# Patient Record
Sex: Male | Born: 1985 | Race: White | Hispanic: No | Marital: Married
Health system: Southern US, Community
[De-identification: ages and names within clinical notes are randomized; demographics above are authoritative.]

---

## 2004-06-14 ENCOUNTER — Emergency Department: Payer: Self-pay | Admitting: General Practice

## 2004-09-09 ENCOUNTER — Emergency Department (HOSPITAL_COMMUNITY): Admission: AC | Admit: 2004-09-09 | Discharge: 2004-09-09 | Payer: Self-pay

## 2004-11-21 ENCOUNTER — Ambulatory Visit: Payer: Self-pay | Admitting: Internal Medicine

## 2004-11-21 ENCOUNTER — Ambulatory Visit: Payer: Self-pay | Admitting: *Deleted

## 2009-07-30 ENCOUNTER — Emergency Department (HOSPITAL_COMMUNITY): Admission: EM | Admit: 2009-07-30 | Discharge: 2009-07-30 | Payer: Self-pay | Admitting: Emergency Medicine

## 2009-10-10 ENCOUNTER — Emergency Department (HOSPITAL_COMMUNITY): Admission: EM | Admit: 2009-10-10 | Discharge: 2009-10-10 | Payer: Self-pay | Admitting: Emergency Medicine

## 2009-12-28 ENCOUNTER — Emergency Department (HOSPITAL_COMMUNITY): Admission: EM | Admit: 2009-12-28 | Discharge: 2009-12-28 | Payer: Self-pay | Admitting: Emergency Medicine

## 2010-02-19 ENCOUNTER — Emergency Department (HOSPITAL_COMMUNITY): Admission: EM | Admit: 2010-02-19 | Discharge: 2010-02-19 | Payer: Self-pay | Admitting: Emergency Medicine

## 2010-07-07 ENCOUNTER — Emergency Department (HOSPITAL_COMMUNITY): Admission: EM | Admit: 2010-07-07 | Discharge: 2010-07-07 | Payer: Self-pay | Admitting: Emergency Medicine

## 2010-07-21 ENCOUNTER — Emergency Department (HOSPITAL_COMMUNITY): Admission: EM | Admit: 2010-07-21 | Discharge: 2009-12-20 | Payer: Self-pay | Admitting: Emergency Medicine

## 2010-08-05 ENCOUNTER — Emergency Department (HOSPITAL_COMMUNITY)
Admission: EM | Admit: 2010-08-05 | Discharge: 2010-08-05 | Payer: Self-pay | Source: Home / Self Care | Admitting: Emergency Medicine

## 2010-10-25 LAB — URINALYSIS, ROUTINE W REFLEX MICROSCOPIC
Bilirubin Urine: NEGATIVE
Glucose, UA: NEGATIVE mg/dL
Hgb urine dipstick: NEGATIVE
Ketones, ur: NEGATIVE mg/dL
Protein, ur: NEGATIVE mg/dL

## 2011-07-17 IMAGING — CR DG FOOT COMPLETE 3+V*L*
3 series · 3 of 3 positions shown · non-contrast
Comparison: None.

CLINICAL DATA: Blunt object fell on foot, pain.

LEFT FOOT - COMPLETE 3+ VIEW

[t foot ap left]
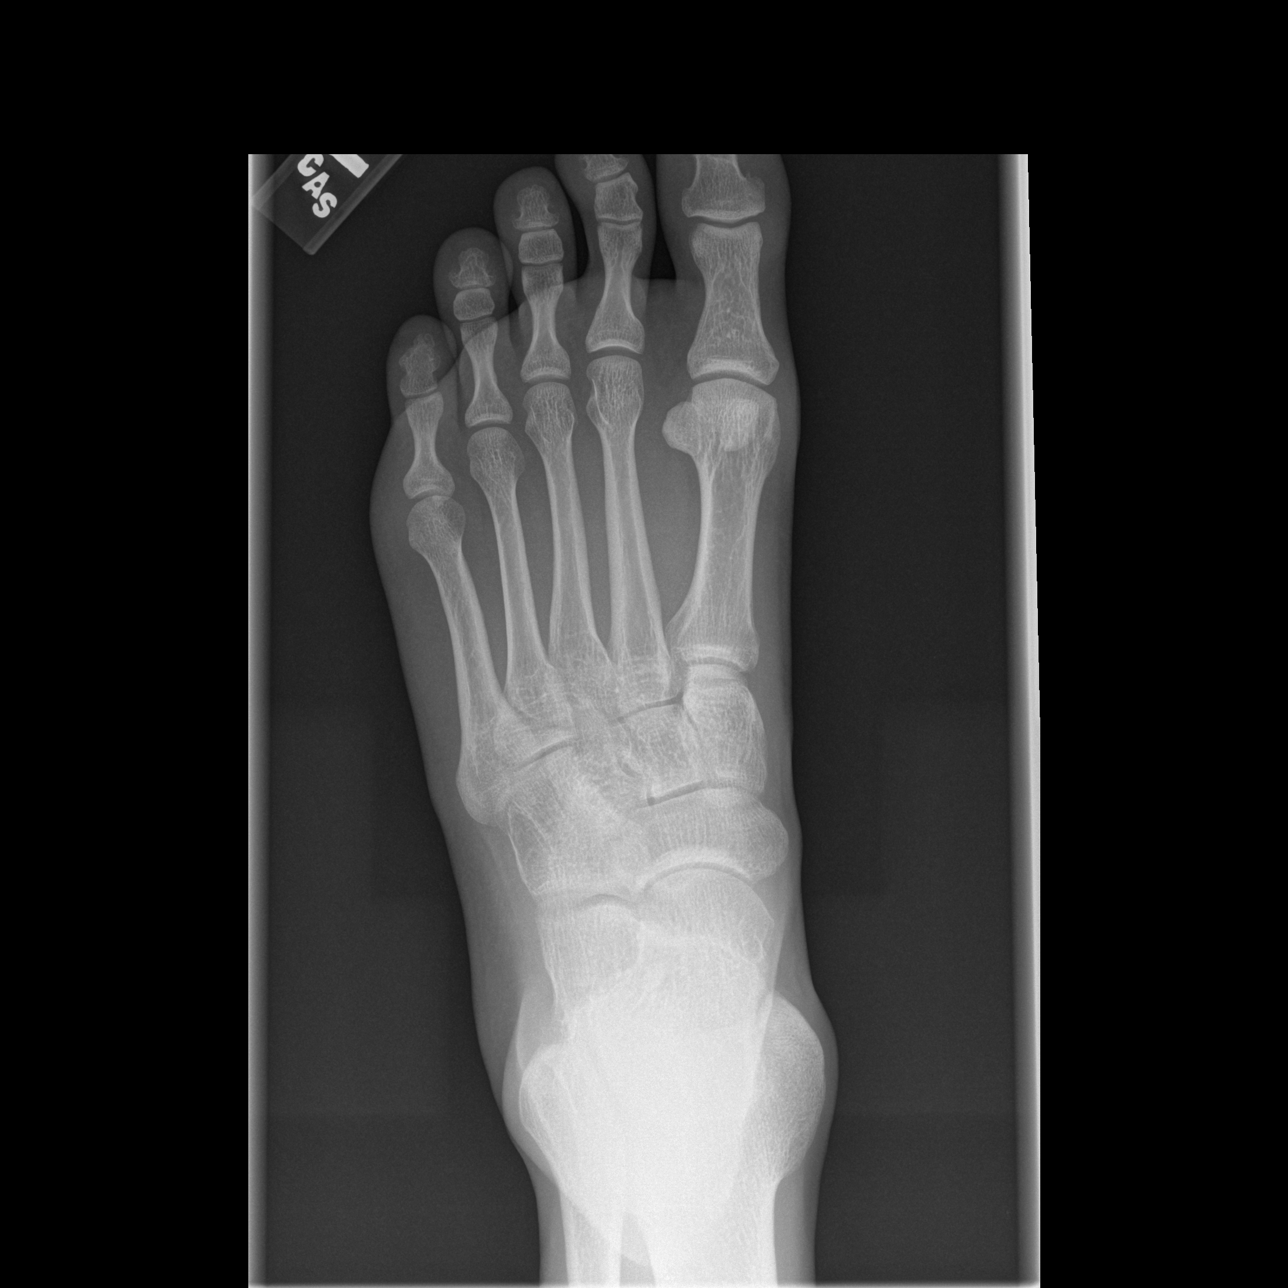

[t foot oblique left]
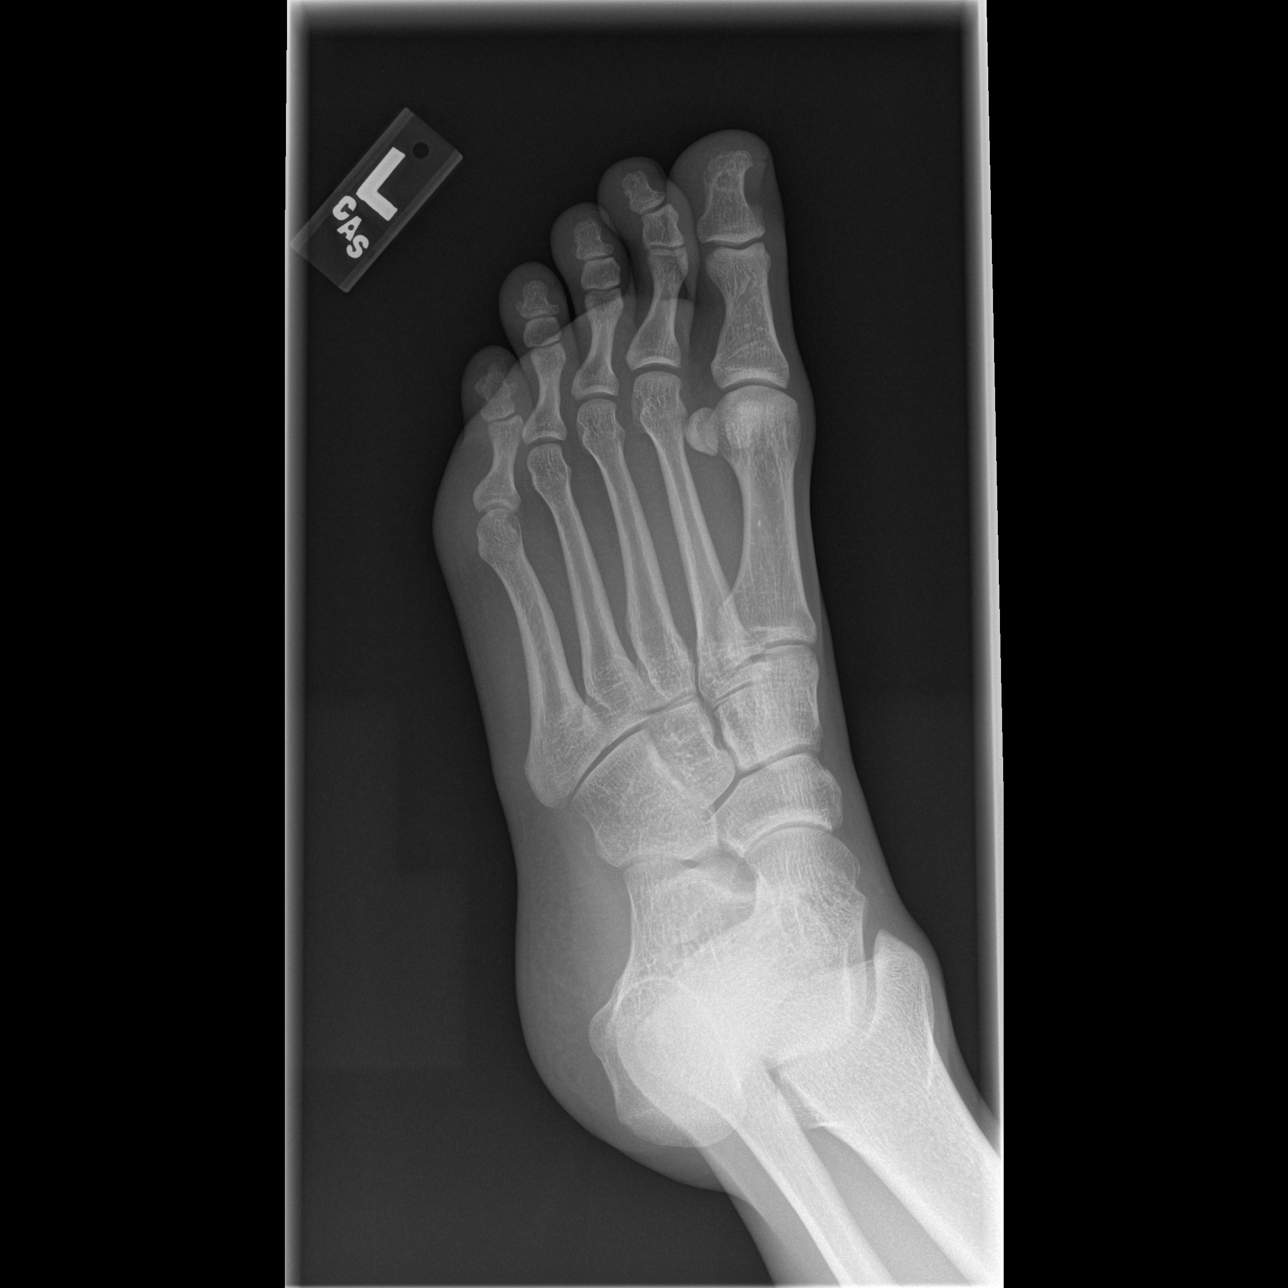

[t foot lat left]
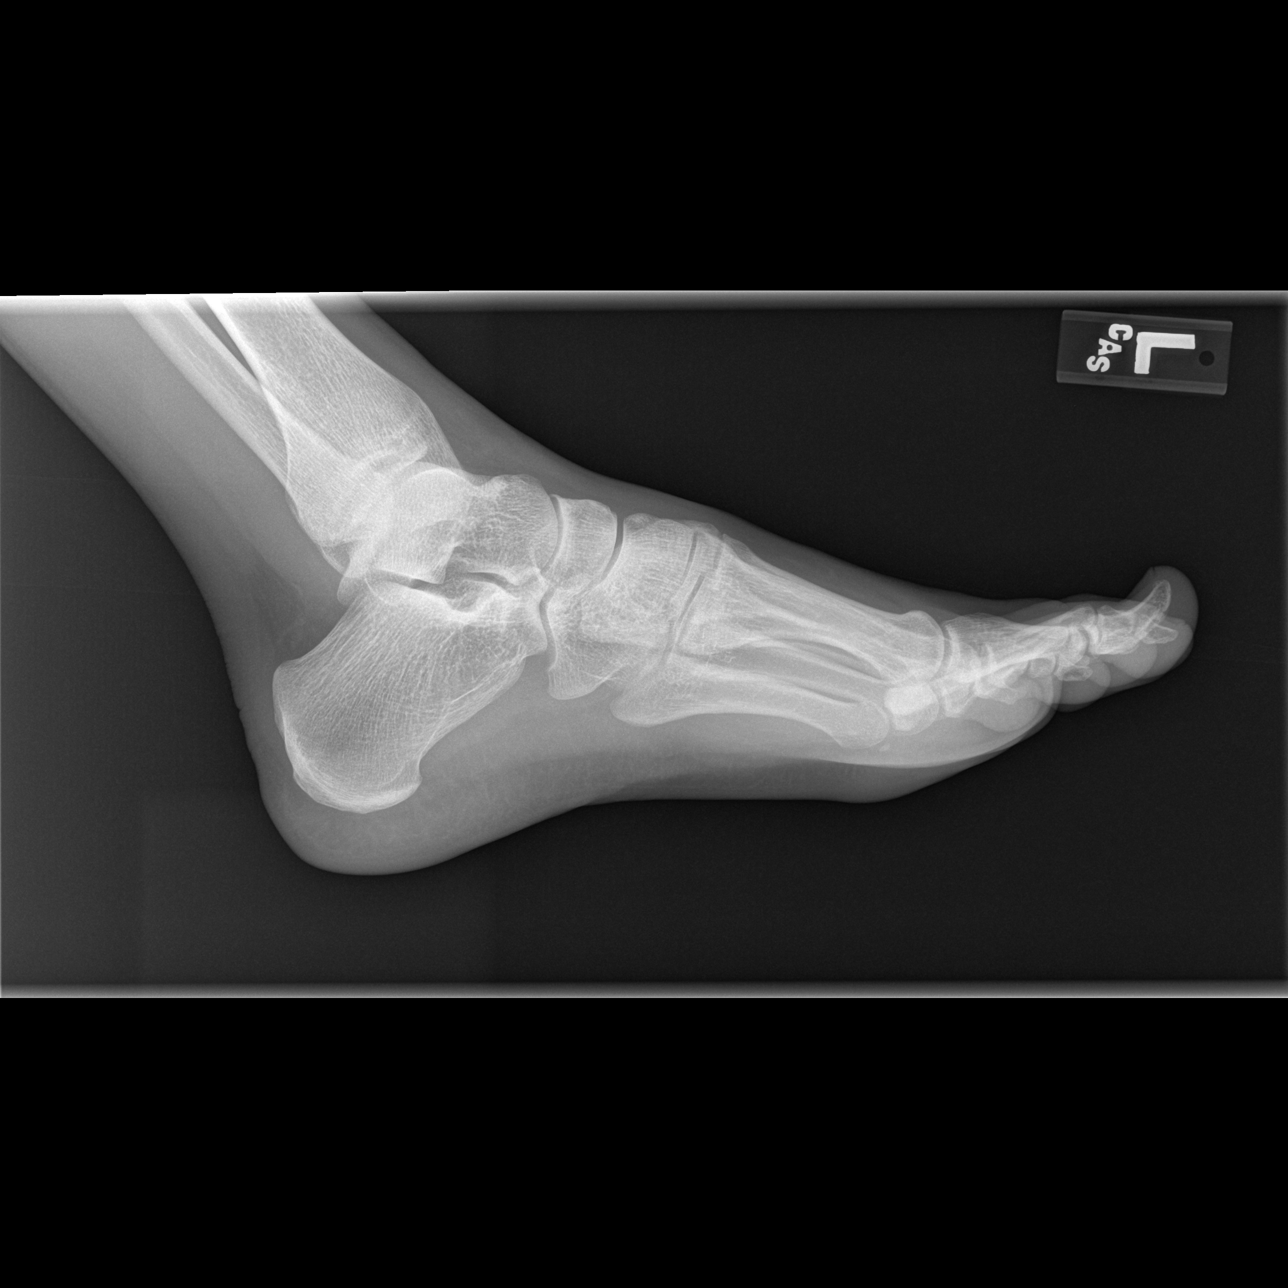

[3 of 3 positions shown; findings below may reference images not displayed]

FINDINGS: There is no evidence of fracture or dislocation.  There
is no evidence of arthropathy or other focal bone abnormality.
Soft tissues are unremarkable.
IMPRESSION: Negative.

## 2012-07-08 ENCOUNTER — Ambulatory Visit: Payer: Self-pay | Admitting: Family Medicine

## 2012-07-08 VITALS — BP 118/76 | HR 69 | Temp 98.2°F | Resp 16 | Ht 68.5 in | Wt 120.0 lb

## 2012-07-08 DIAGNOSIS — Z0289 Encounter for other administrative examinations: Secondary | ICD-10-CM

## 2012-07-08 DIAGNOSIS — Z0000 Encounter for general adult medical examination without abnormal findings: Secondary | ICD-10-CM

## 2012-07-08 NOTE — Progress Notes (Signed)
[facility] [address], [place] [address] [phone]  Date:  07/08/2012   Name:  Richard Bailey   DOB:  Apr 09, 1986   MRN:  [id number]  PCP:  No primary provider on file.    Chief Complaint: Annual Exam   History of Present Illness:  Richard Bailey is a 26 y.o. 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  However, he is not sure why he has to complete this physical.  He received this form once before in 2010.   He denies any medical problems, states he does not abuse any substances.  He has no history of a DUI.  Reports he did receive a ticket/ was arrested for drinking underage at 74 but no other problems. He has had a ticket for seatbelt violation but denies any other driving violations/ accidents.   He is a new patient here today to have his DMV physical completed.  He has no other concerns today.  He has tried to call and find out why he is being sent this form but so far has not been successful   Denies any vision, hearing, neurological, cardiac, pulmonary, mental/emotional, substance abuse, MSK, or other problems   There is no problem list on file for this patient.   No past medical history on file.  No past surgical history on file.  History  Substance Use Topics  . Smoking status: Current Every Day Smoker  . Smokeless tobacco: Not on file  . Alcohol Use: No    No family history on file.  No Known Allergies  Medication list has been reviewed and updated.  No current outpatient prescriptions on file prior to visit.    Review of Systems:  As per HPI- otherwise negative.   Physical Examination: Filed Vitals:   07/08/12 1432  BP: 118/76  Pulse: 69  Temp: 98.2 F (36.8 C)  Resp: 16   Filed Vitals:   07/08/12 1432  Height: 5' 8.5" (1.74 m)  Weight: 120 lb (54.432 kg)   Body mass index is 17.98 kg/(m^2). Ideal Body Weight: Weight in (lb) to have BMI = 25: 166.5    GEN: WDWN, NAD, Non-toxic, A & O x 3.  His wife is present in the room today.  Thin build HEENT: Atraumatic, Normocephalic. Neck supple. No masses, No LAD. Ears and Nose: No external deformity. CV: RRR, No M/G/R. No JVD. No thrill. No extra heart sounds. PULM: CTA B, no wheezes, crackles, rhonchi. No retractions. No resp. distress. No accessory muscle use. ABD: S, NT, ND, +BS. No rebound. No HSM. EXTR: No c/c/e NEURO Normal gait. Normal strength, sensation and DTR all extremities.   PSYCH: Normally interactive. Conversant. Not depressed or anxious appearing.  Calm demeanor.    Assessment and Plan: 1. Physical exam, routine    Completed form for patient- stated that I do not think he needs to continue to follow- up for periodic evaluations.     Abbe Amsterdam, MD

## 2015-11-14 ENCOUNTER — Encounter (HOSPITAL_COMMUNITY): Payer: Self-pay | Admitting: Emergency Medicine

## 2015-11-14 ENCOUNTER — Emergency Department (HOSPITAL_COMMUNITY): Payer: BLUE CROSS/BLUE SHIELD

## 2015-11-14 ENCOUNTER — Emergency Department (HOSPITAL_COMMUNITY)
Admission: EM | Admit: 2015-11-14 | Discharge: 2015-11-14 | Disposition: A | Discharge status: Home / Self Care | Payer: BLUE CROSS/BLUE SHIELD | Attending: Emergency Medicine | Admitting: Emergency Medicine

## 2015-11-14 DIAGNOSIS — M25521 Pain in right elbow: Secondary | ICD-10-CM | POA: Diagnosis not present

## 2015-11-14 DIAGNOSIS — F17200 Nicotine dependence, unspecified, uncomplicated: Secondary | ICD-10-CM | POA: Diagnosis not present

## 2015-11-14 DIAGNOSIS — M791 Myalgia, unspecified site: Secondary | ICD-10-CM

## 2015-11-14 DIAGNOSIS — M25522 Pain in left elbow: Secondary | ICD-10-CM | POA: Diagnosis not present

## 2015-11-14 DIAGNOSIS — R509 Fever, unspecified: Secondary | ICD-10-CM | POA: Insufficient documentation

## 2015-11-14 DIAGNOSIS — M25562 Pain in left knee: Secondary | ICD-10-CM | POA: Insufficient documentation

## 2015-11-14 DIAGNOSIS — M2550 Pain in unspecified joint: Secondary | ICD-10-CM

## 2015-11-14 DIAGNOSIS — M436 Torticollis: Secondary | ICD-10-CM | POA: Diagnosis not present

## 2015-11-14 DIAGNOSIS — R21 Rash and other nonspecific skin eruption: Secondary | ICD-10-CM | POA: Diagnosis not present

## 2015-11-14 DIAGNOSIS — M25561 Pain in right knee: Secondary | ICD-10-CM | POA: Insufficient documentation

## 2015-11-14 DIAGNOSIS — R6889 Other general symptoms and signs: Secondary | ICD-10-CM

## 2015-11-14 DIAGNOSIS — R059 Cough, unspecified: Secondary | ICD-10-CM

## 2015-11-14 DIAGNOSIS — R05 Cough: Secondary | ICD-10-CM | POA: Diagnosis present

## 2015-11-14 DIAGNOSIS — R1110 Vomiting, unspecified: Secondary | ICD-10-CM | POA: Insufficient documentation

## 2015-11-14 LAB — COMPREHENSIVE METABOLIC PANEL
ALBUMIN: 3.4 g/dL — AB (ref 3.5–5.0)
ALK PHOS: 89 U/L (ref 38–126)
ALT: 58 U/L (ref 17–63)
ANION GAP: 10 (ref 5–15)
AST: 69 U/L — ABNORMAL HIGH (ref 15–41)
BUN: 12 mg/dL (ref 6–20)
CO2: 22 mmol/L (ref 22–32)
CREATININE: 0.51 mg/dL — AB (ref 0.61–1.24)
Calcium: 8.4 mg/dL — ABNORMAL LOW (ref 8.9–10.3)
Chloride: 104 mmol/L (ref 101–111)
GFR calc non Af Amer: >60 mL/min (ref >60)
GLUCOSE: 96 mg/dL (ref 65–99)
POTASSIUM: 3.8 mmol/L (ref 3.5–5.1)
Sodium: 136 mmol/L (ref 135–145)
Total Bilirubin: 0.7 mg/dL (ref 0.3–1.2)
Total Protein: 6.5 g/dL (ref 6.5–8.1)

## 2015-11-14 LAB — CBC WITH DIFFERENTIAL/PLATELET
BASOS ABS: 0 10*3/uL (ref 0.0–0.1)
Basophils Relative: 0 %
EOS ABS: 0 10*3/uL (ref 0.0–0.7)
Eosinophils Relative: 0 %
HEMATOCRIT: 33.8 % — AB (ref 39.0–52.0)
HEMOGLOBIN: 12 g/dL — AB (ref 13.0–17.0)
LYMPHS PCT: 12 %
Lymphs Abs: 0.5 10*3/uL — ABNORMAL LOW (ref 0.7–4.0)
MCH: 29.5 pg (ref 26.0–34.0)
MCHC: 35.5 g/dL (ref 30.0–36.0)
MCV: 83 fL (ref 78.0–100.0)
MONOS PCT: 2 %
Monocytes Absolute: 0.1 10*3/uL (ref 0.1–1.0)
NEUTROS ABS: 3.5 10*3/uL (ref 1.7–7.7)
Neutrophils Relative %: 86 %
Platelets: 133 10*3/uL — ABNORMAL LOW (ref 150–400)
RBC: 4.07 MIL/uL — AB (ref 4.22–5.81)
RDW: 13.1 % (ref 11.5–15.5)
WBC: 4.1 10*3/uL (ref 4.0–10.5)

## 2015-11-14 LAB — I-STAT CG4 LACTIC ACID, ED: LACTIC ACID, VENOUS: 1.29 mmol/L (ref 0.5–2.0)

## 2015-11-14 LAB — CK: CK TOTAL: 67 U/L (ref 49–397)

## 2015-11-14 MED ORDER — ONDANSETRON HCL 4 MG/2ML IJ SOLN
4.0000 mg | Freq: Once | INTRAMUSCULAR | Status: AC
  Administered 2015-11-14: 4 mg via INTRAVENOUS
  Filled 2015-11-14: qty 2

## 2015-11-14 MED ORDER — OXYCODONE-ACETAMINOPHEN 5-325 MG PO TABS: 1.0000 | ORAL_TABLET | Freq: Three times a day (TID) | ORAL | Status: DC | PRN

## 2015-11-14 MED ORDER — DOXYCYCLINE HYCLATE 100 MG PO CAPS: ORAL_CAPSULE | ORAL | Status: DC

## 2015-11-14 MED ORDER — SODIUM CHLORIDE 0.9 % IV BOLUS (SEPSIS)
1000.0000 mL | Freq: Once | INTRAVENOUS | Status: AC
  Administered 2015-11-14: 1000 mL via INTRAVENOUS

## 2015-11-14 MED ORDER — MORPHINE SULFATE (PF) 4 MG/ML IV SOLN
4.0000 mg | Freq: Once | INTRAVENOUS | Status: AC
  Administered 2015-11-14: 4 mg via INTRAVENOUS
  Filled 2015-11-14: qty 1

## 2015-11-14 MED ORDER — IBUPROFEN 600 MG PO TABS
600.0000 mg | ORAL_TABLET | Freq: Four times a day (QID) | ORAL | Status: DC | PRN
Start: 2015-11-14 — End: 2015-11-16

## 2015-11-14 MED ORDER — KETOROLAC TROMETHAMINE 30 MG/ML IJ SOLN
30.0000 mg | Freq: Once | INTRAMUSCULAR | Status: AC
  Administered 2015-11-14: 30 mg via INTRAVENOUS
  Filled 2015-11-14: qty 1

## 2015-11-14 NOTE — Discharge Instructions (Signed)

## 2015-11-14 NOTE — ED Provider Notes (Signed)
CSN: [id number]     Arrival date & time 11/14/15  0041 History  By signing my name below, I, Emmanuella Mensah, attest that this documentation has been prepared under the direction and in the presence of Zadie Rhine, MD. Electronically Signed: Angelene Giovanni, ED Scribe. 11/14/2015. 3:35 AM.   Chief Complaint -  Fever and cough  Patient is a 30 y.o. male presenting with rash. The history is provided by the patient. No language interpreter was used.  Rash Location:  Full body Severity:  Moderate Onset quality:  Gradual Timing:  Constant Progression:  Spreading Chronicity:  New Context: not insect bite/sting   Relieved by:  None tried Worsened by:  Nothing tried Ineffective treatments:  None tried Associated symptoms: fever, joint pain and myalgias   Associated symptoms: no headaches, no nausea, no sore throat and not vomiting   Associated symptoms comment:  Cough   HPI Comments: Richard Bailey is a 30 y.o. male who presents to the Emergency Department complaining of gradually worsening generalized joint pain to his elbows, hands, knees, and his ankles onset yesterday. Pt reports associated generalized rash all over his body, mild neck stiffness, and joint swelling. Pt's wife explains that pt had vomiting and a fever one week ago after their son had the same symptoms but pt tested negative for the flu and mono. No alleviating factors noted. Pt was seen today at [facility] and had a normal chest x-ray and negative mono test. 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 Pt denies any current n/v, HA, LOC   PMH - none Soc hx - no travel, no drug abuse Social History  Substance Use Topics  . Smoking status: Current Every Day Smoker  . Smokeless tobacco: None  . Alcohol Use: No    Review of Systems  Constitutional: Positive for fever.  HENT: Negative for sore throat.   Respiratory: Negative for cough.   Gastrointestinal: Negative for nausea and vomiting.   Musculoskeletal: Positive for myalgias, arthralgias and neck stiffness.  Skin: Positive for rash.  Neurological: Negative for syncope and headaches.  All other systems reviewed and are negative.     Allergies  Review of patient's allergies indicates no known allergies.  Home Medications   Prior to Admission medications   Medication Sig Start Date End Date Taking? Authorizing Provider  acetaminophen (TYLENOL) 500 MG tablet Take 1,000 mg by mouth every 6 (six) hours as needed for moderate pain.   Yes Historical Provider, MD  ibuprofen (ADVIL,MOTRIN) 200 MG tablet Take 600 mg by mouth every 6 (six) hours as needed for moderate pain.   Yes Historical Provider, MD   BP 112/65 mmHg  Pulse 75  Temp(Src) 98.2 F (36.8 C) (Oral)  Resp 18  Ht  (1.778 m)  Wt 132 lb (59.875 kg)  BMI 18.94 kg/m2  SpO2 99% Physical Exam  Nursing note and vitals reviewed.  CONSTITUTIONAL: uncomfortable appearing HEAD: Normocephalic/atraumatic EYES: EOMI/PERRL ENMT: Mucous membranes moist, uvula midline, no erythema or exudate NECK: supple no meningeal signs SPINE/BACK:entire spine nontender CV: S1/S2 noted, no murmurs/rubs/gallops noted LUNGS: Lungs are clear to auscultation bilaterally, no apparent distress ABDOMEN: soft, nontender, no rebound or guarding, bowel sounds noted throughout abdomen GU:no cva tenderness NEURO: Pt is awake/alert/appropriate, moves all extremitiesx4.  No facial droop.   EXTREMITIES: pulses normal/equal, full ROM, tenderness to hands/knees/feet, but no significant joint effusion noted.  No warmth noted to any of his joints.   SKIN: warm, color normal, diffuse macular  rash to body that blanches, no petechiae, no rash to palms of hands PSYCH: no abnormalities of mood noted, alert and oriented to situation  ED Course  Procedures DIAGNOSTIC STUDIES: Oxygen Saturation is 99% on RA, normal by my interpretation.    COORDINATION OF CARE: 3:29 AM- Pt advised of plan for  treatment and pt agrees. Pt will receive IV fluids, Morphine, and Zofran. He will also receive lab work for further evaluation. Explained possible admission pending lab results.   7:42 AM Pt is improved He is awake/alert, watching TV He has not been febrile here He is not septic appearing This could be viral illness, however RMSF is not excluded Due to rash/arthralgias, will add on doxycycline for RMSF coverage Blood cultures pending Will d/c home Discussed strict return precautions  Labs Review Labs Reviewed  COMPREHENSIVE METABOLIC PANEL - Abnormal; Notable for the following:    Creatinine, Ser 0.51 (*)    Calcium 8.4 (*)    Albumin 3.4 (*)    AST 69 (*)    All other components within normal limits  CBC WITH DIFFERENTIAL/PLATELET - Abnormal; Notable for the following:    RBC 4.07 (*)    Hemoglobin 12.0 (*)    HCT 33.8 (*)    Platelets 133 (*)    Lymphs Abs 0.5 (*)    All other components within normal limits  CULTURE, BLOOD (ROUTINE X 2)  CULTURE, BLOOD (ROUTINE X 2)  CK  CBC WITH DIFFERENTIAL/PLATELET  ROCKY MTN SPOTTED FVR ABS PNL(IGG+IGM)  URINALYSIS, ROUTINE W REFLEX MICROSCOPIC ([facility])  I-STAT CG4 LACTIC ACID, ED      Zadie Rhineonald Kemarion Abbey, MD has personally reviewed and evaluated these  lab results as part of his medical decision-making.  Medications  sodium chloride 0.9 % bolus 1,000 mL (0 mLs Intravenous Stopped 11/14/15 0549)  morphine 4 MG/ML injection 4 mg (4 mg Intravenous Given 11/14/15 0449)  ondansetron (ZOFRAN) injection 4 mg (4 mg Intravenous Given 11/14/15 0449)  ketorolac (TORADOL) 30 MG/ML injection 30 mg (30 mg Intravenous Given 11/14/15 0648)  morphine 4 MG/ML injection 4 mg (4 mg Intravenous Given 11/14/15 0648)    MDM   Final diagnoses:  Myalgia  Cough  Flu-like symptoms  Arthralgia    Nursing notes including past medical history and social history reviewed and considered in documentation Labs/vital reviewed myself and considered during  evaluation   I personally performed the services described in this documentation, which was scribed in my presence. The recorded information has been reviewed and is accurate.       Zadie Rhineonald Katriona Schmierer, MD 11/14/15 [phone]

## 2015-11-14 NOTE — ED Notes (Signed)
Bed: WA18 Expected date:  Expected time:  Means of arrival:  Comments: Triage 1 

## 2015-11-14 NOTE — ED Notes (Signed)
Pt presents with c/o fever, cough, and non pruritic rash from head to toe. Per wife child dx with flu 9 days ago, father began with same s/s 2 days later, fever, body aches, n/v, cough. denies sore throat. Yesterday all joints began swelling. Pt ill appearing, weak.

## 2015-11-15 ENCOUNTER — Emergency Department (HOSPITAL_COMMUNITY)
Admission: EM | Admit: 2015-11-15 | Discharge: 2015-11-16 | Disposition: A | Discharge status: Home / Self Care | Payer: BLUE CROSS/BLUE SHIELD | Attending: Emergency Medicine | Admitting: Emergency Medicine

## 2015-11-15 ENCOUNTER — Encounter (HOSPITAL_COMMUNITY): Payer: Self-pay | Admitting: Emergency Medicine

## 2015-11-15 DIAGNOSIS — R112 Nausea with vomiting, unspecified: Secondary | ICD-10-CM | POA: Insufficient documentation

## 2015-11-15 DIAGNOSIS — R509 Fever, unspecified: Secondary | ICD-10-CM | POA: Diagnosis not present

## 2015-11-15 DIAGNOSIS — M25442 Effusion, left hand: Secondary | ICD-10-CM | POA: Diagnosis not present

## 2015-11-15 DIAGNOSIS — M25441 Effusion, right hand: Secondary | ICD-10-CM | POA: Diagnosis not present

## 2015-11-15 DIAGNOSIS — Z792 Long term (current) use of antibiotics: Secondary | ICD-10-CM | POA: Diagnosis not present

## 2015-11-15 DIAGNOSIS — F17200 Nicotine dependence, unspecified, uncomplicated: Secondary | ICD-10-CM | POA: Diagnosis not present

## 2015-11-15 DIAGNOSIS — R05 Cough: Secondary | ICD-10-CM | POA: Insufficient documentation

## 2015-11-15 DIAGNOSIS — M25471 Effusion, right ankle: Secondary | ICD-10-CM | POA: Insufficient documentation

## 2015-11-15 DIAGNOSIS — M25432 Effusion, left wrist: Secondary | ICD-10-CM | POA: Insufficient documentation

## 2015-11-15 DIAGNOSIS — M2540 Effusion, unspecified joint: Secondary | ICD-10-CM

## 2015-11-15 DIAGNOSIS — M25472 Effusion, left ankle: Secondary | ICD-10-CM | POA: Diagnosis not present

## 2015-11-15 DIAGNOSIS — M25431 Effusion, right wrist: Secondary | ICD-10-CM | POA: Insufficient documentation

## 2015-11-15 DIAGNOSIS — R21 Rash and other nonspecific skin eruption: Secondary | ICD-10-CM

## 2015-11-15 DIAGNOSIS — M2550 Pain in unspecified joint: Secondary | ICD-10-CM | POA: Diagnosis present

## 2015-11-15 LAB — RETICULOCYTES
RBC.: 4.18 MIL/uL — AB (ref 4.22–5.81)
RETIC CT PCT: 0.5 % (ref 0.4–3.1)
Retic Count, Absolute: 20.9 10*3/uL (ref 19.0–186.0)

## 2015-11-15 LAB — COMPREHENSIVE METABOLIC PANEL
ALT: 88 U/L — AB (ref 17–63)
ANION GAP: 9 (ref 5–15)
AST: 109 U/L — ABNORMAL HIGH (ref 15–41)
Albumin: 3.1 g/dL — ABNORMAL LOW (ref 3.5–5.0)
Alkaline Phosphatase: 91 U/L (ref 38–126)
BUN: 12 mg/dL (ref 6–20)
CHLORIDE: 107 mmol/L (ref 101–111)
CO2: 22 mmol/L (ref 22–32)
CREATININE: 0.52 mg/dL — AB (ref 0.61–1.24)
Calcium: 8.4 mg/dL — ABNORMAL LOW (ref 8.9–10.3)
GFR calc non Af Amer: >60 mL/min (ref >60)
Glucose, Bld: 87 mg/dL (ref 65–99)
Potassium: 4.1 mmol/L (ref 3.5–5.1)
SODIUM: 138 mmol/L (ref 135–145)
Total Bilirubin: 0.5 mg/dL (ref 0.3–1.2)
Total Protein: 6 g/dL — ABNORMAL LOW (ref 6.5–8.1)

## 2015-11-15 LAB — RAPID HIV SCREEN (HIV 1/2 AB+AG)
HIV 1/2 ANTIBODIES: NONREACTIVE
HIV-1 P24 Antigen - HIV24: NONREACTIVE

## 2015-11-15 LAB — CBC WITH DIFFERENTIAL/PLATELET
Basophils Absolute: 0 10*3/uL (ref 0.0–0.1)
Basophils Relative: 0 %
EOS ABS: 0 10*3/uL (ref 0.0–0.7)
EOS PCT: 0 %
HCT: 35.1 % — ABNORMAL LOW (ref 39.0–52.0)
Hemoglobin: 12.3 g/dL — ABNORMAL LOW (ref 13.0–17.0)
LYMPHS ABS: 0.6 10*3/uL — AB (ref 0.7–4.0)
Lymphocytes Relative: 22 %
MCH: 29.4 pg (ref 26.0–34.0)
MCHC: 35 g/dL (ref 30.0–36.0)
MCV: 83.8 fL (ref 78.0–100.0)
Monocytes Absolute: 0.2 10*3/uL (ref 0.1–1.0)
Monocytes Relative: 6 %
Neutro Abs: 1.9 10*3/uL (ref 1.7–7.7)
Neutrophils Relative %: 72 %
PLATELETS: 208 10*3/uL (ref 150–400)
RBC: 4.19 MIL/uL — AB (ref 4.22–5.81)
RDW: 13.4 % (ref 11.5–15.5)
WBC: 2.7 10*3/uL — AB (ref 4.0–10.5)

## 2015-11-15 LAB — I-STAT CG4 LACTIC ACID, ED: Lactic Acid, Venous: 0.75 mmol/L (ref 0.5–2.0)

## 2015-11-15 LAB — ROCKY MTN SPOTTED FVR ABS PNL(IGG+IGM)
RMSF IGM: 0.3 {index} (ref 0.00–0.89)
RMSF IgG: NEGATIVE

## 2015-11-15 MED ORDER — KETOROLAC TROMETHAMINE 30 MG/ML IJ SOLN
30.0000 mg | Freq: Once | INTRAMUSCULAR | Status: AC
  Administered 2015-11-15: 30 mg via INTRAVENOUS
  Filled 2015-11-15: qty 1

## 2015-11-15 MED ORDER — SODIUM CHLORIDE 0.9 % IV BOLUS (SEPSIS)
1000.0000 mL | Freq: Once | INTRAVENOUS | Status: AC
  Administered 2015-11-15: 1000 mL via INTRAVENOUS

## 2015-11-15 MED ORDER — ONDANSETRON HCL 4 MG/2ML IJ SOLN
4.0000 mg | Freq: Once | INTRAMUSCULAR | Status: AC
  Administered 2015-11-15: 4 mg via INTRAVENOUS
  Filled 2015-11-15: qty 2

## 2015-11-15 MED ORDER — METHYLPREDNISOLONE SODIUM SUCC 125 MG IJ SOLR
125.0000 mg | Freq: Once | INTRAMUSCULAR | Status: AC
  Administered 2015-11-15: 125 mg via INTRAVENOUS
  Filled 2015-11-15: qty 2

## 2015-11-15 NOTE — ED Notes (Signed)
HIV non reactive antibody and antigen, S.Joy,PA and Abbie,RN notified

## 2015-11-15 NOTE — ED Notes (Signed)
Pt c/o joint pain and swelling. Pt seen here for rash with flu like s/s yesterday. Awaiting results of [place] Spotted Fever testing. Hand and wrist visibly swollen.

## 2015-11-15 NOTE — ED Notes (Signed)
Awaiting evaluation by provider.  

## 2015-11-15 NOTE — ED Notes (Signed)
Bed: [id number] Expected date:  Expected time:  Means of arrival:  Comments: Clean-no monitor

## 2015-11-15 NOTE — ED Provider Notes (Signed)
CSN: [id number]     Arrival date & time 11/15/15  1909 History   First MD Initiated Contact with Patient 11/15/15 2145     Chief Complaint  Patient presents with  . Joint Pain     (Consider location/radiation/quality/duration/timing/severity/associated sxs/prior Treatment) HPI    Richard Bailey is a 30 y.o. male, patient with no pertinent past medical history, presenting to the ED with joint pain and swelling accompanied by a rash, beginning on March 30. Pt states he began to feel ill with symptoms of fever, cough, nausea, and vomiting, beginning March 24. Pt thought it was the flu because his children had Symptoms similar to his initial symptoms. Patient then developed joint pain and swelling as well as a full body rash. Patient states that this is the third time he has been seen for this complaint. Patient states that he has been taking ibuprofen for his pain. Patient states that he came back today because he feels as though he is getting worse. Patient rates his pain at 9/10, achy, and radiating up his arms and up his legs into his forearms and ankles, respectively. Pt denies neck pain, shortness of breath, abdominal pain, or any other complaints.     History reviewed. No pertinent past medical history. History reviewed. No pertinent past surgical history. No family history on file. Social History  Substance Use Topics  . Smoking status: Current Every Day Smoker  . Smokeless tobacco: None  . Alcohol Use: No    Review of Systems  Constitutional: Positive for fever.  HENT: Negative for mouth sores.   Respiratory: Negative for shortness of breath.   Cardiovascular: Negative for chest pain.  Gastrointestinal: Positive for nausea and vomiting. Negative for diarrhea and blood in stool.  Musculoskeletal: Negative for neck stiffness.  Skin: Positive for rash.  Neurological: Negative for headaches.  All other systems reviewed and are negative.     Allergies  Review of patient's  allergies indicates no known allergies.  Home Medications   Prior to Admission medications   Medication Sig Start Date End Date Taking? Authorizing Provider  acetaminophen (TYLENOL) 500 MG tablet Take 1,000 mg by mouth every 6 (six) hours as needed for moderate pain.   Yes Historical Provider, MD  doxycycline (VIBRAMYCIN) 100 MG capsule One po bid for 14 days Patient taking differently: Take 100 mg by mouth 2 (two) times daily.  11/14/15  Yes Ripley Fraise, MD  oxyCODONE-acetaminophen (PERCOCET/ROXICET) 5-325 MG tablet Take 1 tablet by mouth every 8 (eight) hours as needed for severe pain. 11/14/15  Yes Ripley Fraise, MD  meloxicam (MOBIC) 15 MG tablet Take 1 tablet (15 mg total) by mouth daily. 11/15/15   Ashling Roane C Skye Plamondon, PA-C  traMADol (ULTRAM) 50 MG tablet Take 1 tablet (50 mg total) by mouth every 6 (six) hours as needed. 11/16/15   Roby Donaway C Wilene Pharo, PA-C   BP 116/71 mmHg  Pulse 65  Temp(Src) 98.7 F (37.1 C) (Oral)  Resp 18  SpO2 99% Physical Exam  Constitutional: He is oriented to person, place, and time. He appears well-developed and well-nourished. No distress.  HENT:  Head: Normocephalic and atraumatic.  Mouth/Throat: Oropharynx is clear and moist.  Eyes: Conjunctivae and EOM are normal. Pupils are equal, round, and reactive to light.  Neck: Normal range of motion. Neck supple.  No nuchal rigidity.  Cardiovascular: Normal rate, regular rhythm, normal heart sounds and intact distal pulses.   Pulmonary/Chest: Effort normal and breath sounds normal. No respiratory distress.  Abdominal: Soft.  There is no tenderness. There is no guarding.  Musculoskeletal: He exhibits no edema or tenderness.  Swelling and tenderness to the patient's wrists, hands, and ankles. Movement of these joints is also painful. Full ROM in all extremities and spine. No paraspinal tenderness.   Lymphadenopathy:    He has no cervical adenopathy.  Neurological: He is alert and oriented to person, place, and time. He has  normal reflexes.  No sensory deficits. Strength 5/5 in all extremities. No gait disturbance. Coordination intact. Cranial nerves III-XII grossly intact. No facial droop.   Skin: Skin is warm and dry. Rash noted. He is not diaphoretic.  Diffuse erythematous, macular, lacy rash covering the extremities and the trunk, sparing the palms, soles, face, and genitals.  Psychiatric: He has a normal mood and affect. His behavior is normal.  Nursing note and vitals reviewed.   ED Course  Procedures (including critical care time) Labs Review Labs Reviewed  CBC WITH DIFFERENTIAL/PLATELET - Abnormal; Notable for the following:    WBC 2.7 (*)    RBC 4.19 (*)    Hemoglobin 12.3 (*)    HCT 35.1 (*)    Lymphs Abs 0.6 (*)    All other components within normal limits  COMPREHENSIVE METABOLIC PANEL - Abnormal; Notable for the following:    Creatinine, Ser 0.52 (*)    Calcium 8.4 (*)    Total Protein 6.0 (*)    Albumin 3.1 (*)    AST 109 (*)    ALT 88 (*)    All other components within normal limits  RETICULOCYTES - Abnormal; Notable for the following:    RBC. 4.18 (*)    All other components within normal limits  RAPID HIV SCREEN (HIV 1/2 AB+AG)  SEDIMENTATION RATE  HIV ANTIBODY (ROUTINE TESTING)  RPR  EPSTEIN-BARR VIRUS VCA, IGG  EPSTEIN-BARR VIRUS VCA, IGM  RSV(RESPIRATORY SYNCYTIAL VIRUS) AB, BLOOD  CMV IGM  I-STAT CG4 LACTIC ACID, ED  I-STAT CG4 LACTIC ACID, ED  GC/CHLAMYDIA PROBE AMP ([facility]) NOT AT [facility]     Results for orders placed or performed during the hospital encounter of 11/15/15  CBC with Differential  Result Value Ref Range   WBC 2.7 (L) 4.0 - 10.5 K/uL   RBC 4.19 (L) 4.22 - 5.81 MIL/uL   Hemoglobin 12.3 (L) 13.0 - 17.0 g/dL   HCT 35.1 (L) 39.0 - 52.0 %   MCV 83.8 78.0 - 100.0 fL   MCH 29.4 26.0 - 34.0 pg   MCHC 35.0 30.0 - 36.0 g/dL   RDW 13.4 11.5 - 15.5 %   Platelets 208 150 - 400 K/uL   Neutrophils Relative % 72 %   Neutro Abs 1.9 1.7 - 7.7 K/uL    Lymphocytes Relative 22 %   Lymphs Abs 0.6 (L) 0.7 - 4.0 K/uL   Monocytes Relative 6 %   Monocytes Absolute 0.2 0.1 - 1.0 K/uL   Eosinophils Relative 0 %   Eosinophils Absolute 0.0 0.0 - 0.7 K/uL   Basophils Relative 0 %   Basophils Absolute 0.0 0.0 - 0.1 K/uL  Comprehensive metabolic panel  Result Value Ref Range   Sodium 138 135 - 145 mmol/L   Potassium 4.1 3.5 - 5.1 mmol/L   Chloride 107 101 - 111 mmol/L   CO2 22 22 - 32 mmol/L   Glucose, Bld 87 65 - 99 mg/dL   BUN 12 6 - 20 mg/dL   Creatinine, Ser 0.52 (L) 0.61 - 1.24 mg/dL   Calcium 8.4 (L) 8.9 -  10.3 mg/dL   Total Protein 6.0 (L) 6.5 - 8.1 g/dL   Albumin 3.1 (L) 3.5 - 5.0 g/dL   AST 109 (H) 15 - 41 U/L   ALT 88 (H) 17 - 63 U/L   Alkaline Phosphatase 91 38 - 126 U/L   Total Bilirubin 0.5 0.3 - 1.2 mg/dL   GFR calc non Af Amer >60 >60 mL/min   GFR calc Af Amer >60 >60 mL/min   Anion gap 9 5 - 15  Rapid HIV screen (HIV 1/2 Ab+Ag)  Result Value Ref Range   HIV-1 P24 Antigen - HIV24 NON REACTIVE NON REACTIVE   HIV 1/2 Antibodies NON REACTIVE NON REACTIVE   Interpretation (HIV Ag Ab)      A non reactive test result means that HIV 1 or HIV 2 antibodies and HIV 1 p24 antigen were not detected in the specimen.  Sedimentation rate  Result Value Ref Range   Sed Rate 13 0 - 16 mm/hr  Reticulocytes  Result Value Ref Range   Retic Ct Pct 0.5 0.4 - 3.1 %   RBC. 4.18 (L) 4.22 - 5.81 MIL/uL   Retic Count, Manual 20.9 19.0 - 186.0 K/uL  I-Stat CG4 Lactic Acid, ED  Result Value Ref Range   Lactic Acid, Venous 0.75 0.5 - 2.0 mmol/L   No results found.    Imaging Review No results found. I have personally reviewed and evaluated these lab results as part of my medical decision-making.   EKG Interpretation None       Medications  sodium chloride 0.9 % bolus 1,000 mL (0 mLs Intravenous Stopped 11/16/15 0021)  ketorolac (TORADOL) 30 MG/ML injection 30 mg (30 mg Intravenous Given 11/15/15 2229)  ondansetron (ZOFRAN) injection 4  mg (4 mg Intravenous Given 11/15/15 2229)  methylPREDNISolone sodium succinate (SOLU-MEDROL) 125 mg/2 mL injection 125 mg (125 mg Intravenous Given 11/15/15 2248)  morphine 4 MG/ML injection 4 mg (4 mg Intravenous Given 11/16/15 0020)     MDM   Final diagnoses:  Joint pain  Joint swelling  Rash    Della Goo presents a full body rash with joint swelling and pain, preceded by flu like symptoms.   Findings and plan of care discussed with Deno Etienne, DO. Dr. Tyrone Nine personally evaluated and examined this patient.  Patient was seen for these exact symptoms yesterday. Blood cultures have come back negative after 1 day. Patient had negative lactate. [place] Spotted Fever came back negative. Patient was prophylactically placed on doxycycline upon discharge yesterday. Pt has no evidence of systemic infection in his vitals or his labs. No lymphadenopathy. Patient is nontoxic appearing, afebrile, not tachycardic, not tachypneic, maintains SPO2 of 98-99% on room air, and is in no apparent distress. Patient has no signs of sepsis or other serious or life-threatening condition. Infectious disease consult placed, due to the patient's presentation and lack of abnormalities on previous labs. 10:34 PM Spoke with Dr. Linus Salmons, [facility] specialist, who suspects this may be reactive arthritis from a viral illness. States that it would be logical to check an ESR, gonorrhea, chlamydia, syphilis, HIV, and possibly also check for EBV or CMV. Patient may further benefit from steroids. See Dr. Miachel Roux note regarding the patient's comments regarding his true purpose for coming in this evening. EBV, CMV, HIV antibody, RPR, RSV, GC, and Chlamydia are all labs that will not result prior to the patient's discharge and the results will need to be called to the patient if they are abnormal. Labs results were  communicated with and explained to the patient and his wife. No indications for imaging at this time. Patient meets no  admission criteria at this time. Pt advised to follow up with a PCP should symptoms continue. Return precautions discussed. Patient voiced understanding of these instructions, accepts the plan, and is comfortable with discharge.   Filed Vitals:   11/15/15 1921 11/15/15 2238  BP: 114/69 116/71  Pulse: 88 65  Temp: 98.3 F (36.8 C) 98.7 F (37.1 C)  TempSrc: Oral Oral  Resp: 16 18  SpO2: 98% 99%      Lorayne Bender, PA-C 11/16/15 [place], DO 11/16/15 0100

## 2015-11-16 LAB — HIV ANTIBODY (ROUTINE TESTING W REFLEX): HIV SCREEN 4TH GENERATION: NONREACTIVE

## 2015-11-16 LAB — RPR: RPR: NONREACTIVE

## 2015-11-16 LAB — SEDIMENTATION RATE: SED RATE: 13 mm/h (ref 0–16)

## 2015-11-16 MED ORDER — TRAMADOL HCL 50 MG PO TABS: 50.0000 mg | ORAL_TABLET | Freq: Four times a day (QID) | ORAL | Status: DC | PRN

## 2015-11-16 MED ORDER — MELOXICAM 15 MG PO TABS: 15.0000 mg | ORAL_TABLET | Freq: Every day | ORAL | Status: DC

## 2015-11-16 MED ORDER — MORPHINE SULFATE (PF) 4 MG/ML IV SOLN
4.0000 mg | Freq: Once | INTRAVENOUS | Status: AC
  Administered 2015-11-16: 4 mg via INTRAVENOUS
  Filled 2015-11-16: qty 1

## 2015-11-16 NOTE — ED Notes (Signed)
Patient was alert, oriented and stable upon discharge. RN went over AVS and patient had no further questions.  

## 2015-11-16 NOTE — Discharge Instructions (Signed)
You have been seen today for joint pain and swelling accompanied by a rash. The suspicion is that you have a reactive arthritis secondary to a viral illness. You're been tested for various viruses that could be the cause of your symptoms. Your lab tests showed no abnormalities so far. There are still results pending. If these results are abnormal, they will be called to you. Follow up with PCP as needed should symptoms continue. A resource guide is included below to help you choose a PCP. Return to ED should symptoms worsen.  RESOURCE GUIDE  Chronic Pain Problems: Contact [facility]  [phone] Patients need to be referred by their primary care doctor.  Insufficient Money for Medicine: Contact United Way:  call "211" or [facility] [phone].  No Primary Care Doctor: - Call Health Connect  [phone] - can help you locate a primary care doctor that  accepts your insurance, provides certain services, etc. - Physician Referral Service- [phone]  Agencies that provide inexpensive medical care: - [facility]  [phone] - [facility]  [phone] - [facility]  [phone] - Women's Clinic  [phone] - Planned Parenthood  [phone] Haynes Bast- [facility]  [phone]  Medicaid-accepting [facility] Providers: - [facility]- [address], [address]  [phone], Mon-Fri 9am-7pm, Sat 9am-1pm - [facility]- [address], [address] [place]  [phone] - [facility]- [address], [address] [place]  [phone] [facility]- [address]  [phone] - Renaye RakersVeita Bland- [address], [address], [phone]  Only accepts [place] patients after they have their name  applied to their card  Self Pay (no insurance) in [place]: - Sickle Cell Patients: Dr Willey BladeEric Dean, [facility]  [address],  [phone] - [facility]- [address]  [phone]       Redge Gainer-     [facility]- [address], [address]       -     [facility]- see information above (Speak to CitigroupPam H if you do not have insurance)       -  [facility]- [address], [phone]       -  [facility]- [address],  [phone]       -  [facility]- [address], [phone]       -  Dr Julio Sickssei-Bonsu-  [address], [address], [place], [phone]       -  [facility]- [address], [phone]       -  [facility]- [address], [phone], also [address], [phone]       -    [facility]- [address], [phone], 1st & 3rd Saturday   every month, 10am-1pm  1) Find a Doctor and Pay Out of Pocket Although you won't have to find out who is covered by your insurance plan, it is a good idea to ask around and get recommendations. You will then need to call the office and see if the doctor you have chosen will accept you as a new patient and what types of options they offer for patients who are self-pay. Some doctors offer discounts or will set up payment plans for  their patients who do not have insurance, but you will need to ask so you aren't surprised when you get to your appointment.  2) Contact Your Local Health Department Not all health departments have doctors that can see patients for sick visits, but many do, so it is worth a call to see if yours does. If you don't know where your local health department is, you can check in your phone book. The CDC also has a tool to help you locate your state's health department, and many state websites also have listings of all of their local health departments.  3) Find a Walk-in Clinic If your illness is not likely to be very severe or complicated, you may want to try a walk in clinic. These are popping up all over the country in pharmacies,  drugstores, and shopping centers. They're usually staffed by nurse practitioners or physician assistants that have been trained to treat common illnesses and complaints. They're usually fairly quick and inexpensive. However, if you have serious medical issues or chronic medical problems, these are probably not your best option  STD Testing - [facility] of [facility] Ramona, STD Clinic, [address], [place], phone [phone] or [phone].  Monday - Friday, call for an appointment. [facility] Department of [employer], STD Clinic, [place] [address], [place], phone [phone] or [phone].  Monday - Friday, call for an appointment.  Abuse/Neglect: [facility] Child Abuse Hotline 534 542 8732 [facility] Child Abuse Hotline (647)329-4806 (After Hours)  Emergency Shelter:  [facility] [phone])[phone]  Maternity Homes: - Room at the [place] [facility] [phone] - [facility] [phone]  MRSA Hotline #:   872-280-9380  [facility] Resources  [facility] [place]  United Way [facility] Dept. [address]                 [address]         [address]  Blondell Reveal Phone:  [phone]                                  Phone:  [phone]                   Phone:  [phone]  [facility] Mental Health, [phone] - [phone]       -     [facility] in [place], [address],                                  [phone], Insurance  [place] Child Abuse Hotline (423)080-0462 or [phone] (After Hours)   Behavioral Health Services  Substance Abuse Resources: - [facility] Drug [facility]  [phone] - [facility]  [phone] - The  [place] [phone] Chinita Pester [phone] - Residential & Outpatient Substance Abuse Program  [phone]  Psychological Services: - [place]  [place], [phone] [place]. [address], [place], [place]: [phone] or [phone], PicCapture.uy  Dental Assistance  If unable to pay or uninsured, contact:  [facility] or [facility]. to become qualified for the adult dental clinic.  Patients with Medicaid: [facility] [phone] [address], [place] [address] [phone]  If unable to pay, or uninsured, contact [facility] [phone]) or [place] [phone] in [place], [place] in [facility]) to become qualified for the adult dental clinic   Other [place]- [place], [place], [place], [address], [place], [place], 2nd and 4th Thursday of the month at 6:30am.  10 clients each day by appointment, can sometimes see walk-in patients if someone does not show for an appointment. [facility]- [address] Hillard Danker [place], [place], [address], [place], [place], [place], [address], [place] Department- [place] Department- [place] Department- [phone]

## 2015-11-17 LAB — EPSTEIN-BARR VIRUS VCA, IGM: EBV VCA IgM: <36 U/mL (ref 0.0–35.9)

## 2015-11-17 LAB — GC/CHLAMYDIA PROBE AMP (~~LOC~~) NOT AT ARMC
CHLAMYDIA, DNA PROBE: NEGATIVE
NEISSERIA GONORRHEA: NEGATIVE

## 2015-11-17 LAB — EPSTEIN-BARR VIRUS VCA, IGG: EBV VCA IgG: >600 U/mL — ABNORMAL HIGH (ref 0.0–17.9)

## 2015-11-17 LAB — CMV IGM

## 2015-11-18 LAB — RSV(RESPIRATORY SYNCYTIAL VIRUS) AB, BLOOD: RSV Ab: NEGATIVE

## 2015-11-19 LAB — CULTURE, BLOOD (ROUTINE X 2)
CULTURE: NO GROWTH
Culture: NO GROWTH

## 2022-01-25 ENCOUNTER — Emergency Department (HOSPITAL_COMMUNITY)
Admission: EM | Admit: 2022-01-25 | Discharge: 2022-01-26 | Disposition: A | Discharge status: Home / Self Care | Payer: BC Managed Care – PPO | Attending: Emergency Medicine | Admitting: Emergency Medicine

## 2022-01-25 ENCOUNTER — Other Ambulatory Visit: Payer: Self-pay

## 2022-01-25 DIAGNOSIS — T63481A Toxic effect of venom of other arthropod, accidental (unintentional), initial encounter: Secondary | ICD-10-CM | POA: Diagnosis not present

## 2022-01-25 DIAGNOSIS — F17200 Nicotine dependence, unspecified, uncomplicated: Secondary | ICD-10-CM | POA: Insufficient documentation

## 2022-01-25 DIAGNOSIS — T620X4A Toxic effect of ingested mushrooms, undetermined, initial encounter: Secondary | ICD-10-CM

## 2022-01-25 DIAGNOSIS — E876 Hypokalemia: Secondary | ICD-10-CM

## 2022-01-25 MED ORDER — HALOPERIDOL LACTATE 5 MG/ML IJ SOLN: 5.0000 mg | Freq: Once | INTRAMUSCULAR | Status: DC | PRN
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
## 2022-01-26 LAB — COMPREHENSIVE METABOLIC PANEL
ALT: 16 U/L (ref 0–44)
AST: 26 U/L (ref 15–41)
Albumin: 2.6 g/dL — ABNORMAL LOW (ref 3.5–5.0)
Alkaline Phosphatase: 68 U/L (ref 38–126)
Anion gap: 4 — ABNORMAL LOW (ref 5–15)
BUN: 10 mg/dL (ref 6–20)
CO2: 20 mmol/L — ABNORMAL LOW (ref 22–32)
Calcium: 6.4 mg/dL — CL (ref 8.9–10.3)
Chloride: 116 mmol/L — ABNORMAL HIGH (ref 98–111)
Creatinine, Ser: 0.44 mg/dL — ABNORMAL LOW (ref 0.61–1.24)
GFR, Estimated: >60 mL/min (ref >60)
Glucose, Bld: 84 mg/dL (ref 70–99)
Potassium: 2.9 mmol/L — ABNORMAL LOW (ref 3.5–5.1)
Sodium: 140 mmol/L (ref 135–145)
Total Bilirubin: 1 mg/dL (ref 0.3–1.2)
Total Protein: 5 g/dL — ABNORMAL LOW (ref 6.5–8.1)

## 2022-01-26 LAB — CBC WITH DIFFERENTIAL/PLATELET
Abs Immature Granulocytes: 0.04 10*3/uL (ref 0.00–0.07)
Basophils Absolute: 0 10*3/uL (ref 0.0–0.1)
Basophils Relative: 0 %
Eosinophils Absolute: 0 10*3/uL (ref 0.0–0.5)
Eosinophils Relative: 0 %
HCT: 36.9 % — ABNORMAL LOW (ref 39.0–52.0)
Hemoglobin: 12.4 g/dL — ABNORMAL LOW (ref 13.0–17.0)
Immature Granulocytes: 0 %
Lymphocytes Relative: 10 %
Lymphs Abs: 0.9 10*3/uL (ref 0.7–4.0)
MCH: 30.4 pg (ref 26.0–34.0)
MCHC: 33.6 g/dL (ref 30.0–36.0)
MCV: 90.4 fL (ref 80.0–100.0)
Monocytes Absolute: 0.8 10*3/uL (ref 0.1–1.0)
Monocytes Relative: 9 %
Neutro Abs: 7.2 10*3/uL (ref 1.7–7.7)
Neutrophils Relative %: 81 %
Platelets: 280 10*3/uL (ref 150–400)
RBC: 4.08 MIL/uL — ABNORMAL LOW (ref 4.22–5.81)
RDW: 12.2 % (ref 11.5–15.5)
WBC: 8.9 10*3/uL (ref 4.0–10.5)
nRBC: 0 % (ref 0.0–0.2)

## 2022-01-26 LAB — ETHANOL: Alcohol, Ethyl (B): <10 mg/dL (ref <10)

## 2022-01-26 MED ORDER — DIPHENHYDRAMINE HCL 50 MG/ML IJ SOLN
25.0000 mg | Freq: Once | INTRAMUSCULAR | Status: AC
  Administered 2022-01-26: 25 mg via INTRAVENOUS
  Filled 2022-01-26: qty 1

## 2022-01-26 MED ORDER — POTASSIUM CHLORIDE 20 MEQ PO PACK: 40.0000 meq | PACK | Freq: Every day | ORAL | Status: DC

## 2022-01-26 MED ORDER — CALCIUM CARBONATE ANTACID 500 MG PO CHEW: CHEWABLE_TABLET | ORAL | 0 refills | Status: AC

## 2022-01-26 MED ORDER — CALCIUM GLUCONATE-NACL 1-0.675 GM/50ML-% IV SOLN
1.0000 g | Freq: Once | INTRAVENOUS | Status: AC
  Administered 2022-01-26: 1000 mg via INTRAVENOUS
  Filled 2022-01-26: qty 50

## 2022-01-26 MED ORDER — POTASSIUM CHLORIDE CRYS ER 20 MEQ PO TBCR: 20.0000 meq | EXTENDED_RELEASE_TABLET | Freq: Two times a day (BID) | ORAL | 0 refills | Status: AC

## 2022-01-26 NOTE — ED Notes (Signed)
I provided reinforced discharge education based off of discharge instructions. Pt acknowledged and understood my education. Pt had no further questions/concerns for provider/myself.  °

## 2022-09-21 ENCOUNTER — Emergency Department (HOSPITAL_COMMUNITY): Payer: Commercial Managed Care - HMO

## 2022-09-21 ENCOUNTER — Observation Stay (HOSPITAL_COMMUNITY)
Admission: EM | Admit: 2022-09-21 | Discharge: 2022-09-22 | Disposition: A | Discharge status: Home / Self Care | Payer: Commercial Managed Care - HMO | Attending: General Surgery | Admitting: General Surgery

## 2022-09-21 ENCOUNTER — Encounter (HOSPITAL_COMMUNITY): Payer: Self-pay

## 2022-09-21 DIAGNOSIS — S71131A Puncture wound without foreign body, right thigh, initial encounter: Secondary | ICD-10-CM | POA: Diagnosis present

## 2022-09-21 DIAGNOSIS — T148XXA Other injury of unspecified body region, initial encounter: Principal | ICD-10-CM

## 2022-09-21 DIAGNOSIS — Z23 Encounter for immunization: Secondary | ICD-10-CM | POA: Diagnosis not present

## 2022-09-21 DIAGNOSIS — Y92019 Unspecified place in single-family (private) house as the place of occurrence of the external cause: Secondary | ICD-10-CM | POA: Insufficient documentation

## 2022-09-21 DIAGNOSIS — S71111A Laceration without foreign body, right thigh, initial encounter: Secondary | ICD-10-CM | POA: Diagnosis present

## 2022-09-21 MED ORDER — SODIUM CHLORIDE 0.9 % IV SOLN
INTRAVENOUS | Status: AC | PRN
  Administered 2022-09-21 (×2): 1000 mL via INTRAVENOUS

## 2022-09-21 NOTE — ED Notes (Signed)
MD stapled both lacerations , bleeding controled, MD reports some oozing
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
## 2022-09-21 NOTE — ED Notes (Signed)
Tourniquet removal 2336
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
## 2022-09-21 NOTE — ED Notes (Signed)
Pt is able to verbalize name and other demographics.

## 2022-09-22 ENCOUNTER — Other Ambulatory Visit: Payer: Self-pay

## 2022-09-22 ENCOUNTER — Other Ambulatory Visit (HOSPITAL_COMMUNITY): Payer: Self-pay

## 2022-09-22 DIAGNOSIS — S71111A Laceration without foreign body, right thigh, initial encounter: Secondary | ICD-10-CM | POA: Diagnosis present

## 2022-09-22 LAB — TYPE AND SCREEN
ABO/RH(D): A POS
Antibody Screen: NEGATIVE
Unit division: 0
Unit division: 0

## 2022-09-22 LAB — BPAM RBC
Blood Product Expiration Date: 202403062359
Blood Product Expiration Date: 202403082359
ISSUE DATE / TIME: 202402082337
ISSUE DATE / TIME: 202402082348
Unit Type and Rh: 5100
Unit Type and Rh: 5100

## 2022-09-22 LAB — CBC
HCT: 37.1 % — ABNORMAL LOW (ref 39.0–52.0)
HCT: 38.6 % — ABNORMAL LOW (ref 39.0–52.0)
Hemoglobin: 11.4 g/dL — ABNORMAL LOW (ref 13.0–17.0)
Hemoglobin: 13 g/dL (ref 13.0–17.0)
MCH: 29.5 pg (ref 26.0–34.0)
MCH: 29.5 pg (ref 26.0–34.0)
MCHC: 30.7 g/dL (ref 30.0–36.0)
MCHC: 33.7 g/dL (ref 30.0–36.0)
MCV: 87.7 fL (ref 80.0–100.0)
MCV: 96.1 fL (ref 80.0–100.0)
Platelets: 256 10*3/uL (ref 150–400)
Platelets: 357 10*3/uL (ref 150–400)
RBC: 3.86 MIL/uL — ABNORMAL LOW (ref 4.22–5.81)
RBC: 4.4 MIL/uL (ref 4.22–5.81)
RDW: 13.1 % (ref 11.5–15.5)
RDW: 14.7 % (ref 11.5–15.5)
WBC: 10.6 10*3/uL — ABNORMAL HIGH (ref 4.0–10.5)
WBC: 7 10*3/uL (ref 4.0–10.5)
nRBC: 0 % (ref 0.0–0.2)
nRBC: 0 % (ref 0.0–0.2)

## 2022-09-22 LAB — I-STAT CHEM 8, ED
BUN: 13 mg/dL (ref 6–20)
Calcium, Ion: 1.18 mmol/L (ref 1.15–1.40)
Chloride: 104 mmol/L (ref 98–111)
Creatinine, Ser: 1 mg/dL (ref 0.61–1.24)
Glucose, Bld: 141 mg/dL — ABNORMAL HIGH (ref 70–99)
HCT: 34 % — ABNORMAL LOW (ref 39.0–52.0)
Hemoglobin: 11.6 g/dL — ABNORMAL LOW (ref 13.0–17.0)
Potassium: 3.8 mmol/L (ref 3.5–5.1)
Sodium: 140 mmol/L (ref 135–145)
TCO2: 19 mmol/L — ABNORMAL LOW (ref 22–32)

## 2022-09-22 LAB — RAPID URINE DRUG SCREEN, HOSP PERFORMED
Amphetamines: POSITIVE — AB
Barbiturates: NOT DETECTED
Benzodiazepines: POSITIVE — AB
Cocaine: POSITIVE — AB
Opiates: NOT DETECTED
Tetrahydrocannabinol: POSITIVE — AB

## 2022-09-22 LAB — HIV ANTIBODY (ROUTINE TESTING W REFLEX): HIV Screen 4th Generation wRfx: NONREACTIVE

## 2022-09-22 LAB — ABO/RH: ABO/RH(D): A POS

## 2022-09-22 LAB — CREATININE, SERUM
Creatinine, Ser: 1.21 mg/dL (ref 0.61–1.24)
GFR, Estimated: >60 mL/min (ref >60)

## 2022-09-22 LAB — BLOOD PRODUCT ORDER (VERBAL) VERIFICATION

## 2022-09-22 MED ORDER — ONDANSETRON HCL 4 MG/2ML IJ SOLN: 4.0000 mg | Freq: Four times a day (QID) | INTRAMUSCULAR | Status: DC | PRN

## 2022-09-22 MED ORDER — ACETAMINOPHEN 500 MG PO TABS
1000.0000 mg | ORAL_TABLET | Freq: Four times a day (QID) | ORAL | 0 refills | Status: AC | PRN
  Filled 2022-09-22: qty 56, 7d supply, fill #0

## 2022-09-22 MED ORDER — SODIUM CHLORIDE 0.9 % IV SOLN: INTRAVENOUS | Status: DC

## 2022-09-22 MED ORDER — IBUPROFEN 600 MG PO TABS
600.0000 mg | ORAL_TABLET | Freq: Three times a day (TID) | ORAL | 0 refills | Status: AC | PRN
  Filled 2022-09-22: qty 21, 7d supply, fill #0

## 2022-09-22 MED ORDER — LORAZEPAM 2 MG/ML IJ SOLN: 2.0000 mg | Freq: Once | INTRAMUSCULAR | Status: DC | PRN

## 2022-09-22 MED ORDER — CEFAZOLIN SODIUM-DEXTROSE 2-4 GM/100ML-% IV SOLN
2.0000 g | Freq: Once | INTRAVENOUS | Status: AC
  Administered 2022-09-22: 2 g via INTRAVENOUS
  Filled 2022-09-22: qty 100

## 2022-09-22 MED ORDER — ACETAMINOPHEN 325 MG PO TABS: 650.0000 mg | ORAL_TABLET | ORAL | Status: DC | PRN

## 2022-09-22 MED ORDER — OXYCODONE HCL 5 MG PO TABS
5.0000 mg | ORAL_TABLET | ORAL | Status: DC | PRN
  Administered 2022-09-22: 5 mg via ORAL
  Filled 2022-09-22: qty 1

## 2022-09-22 MED ORDER — TETANUS-DIPHTH-ACELL PERTUSSIS 5-2.5-18.5 LF-MCG/0.5 IM SUSY
0.5000 mL | PREFILLED_SYRINGE | Freq: Once | INTRAMUSCULAR | Status: AC
  Administered 2022-09-22: 0.5 mL via INTRAMUSCULAR
  Filled 2022-09-22: qty 0.5

## 2022-09-22 MED ORDER — ENOXAPARIN SODIUM 30 MG/0.3ML IJ SOSY: 30.0000 mg | PREFILLED_SYRINGE | Freq: Two times a day (BID) | INTRAMUSCULAR | Status: DC

## 2022-09-22 MED ORDER — ONDANSETRON 4 MG PO TBDP: 4.0000 mg | ORAL_TABLET | Freq: Four times a day (QID) | ORAL | Status: DC | PRN
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
## 2022-09-22 NOTE — TOC CAGE-AID Note (Signed)
Transition of Care [facility]) - CAGE-AID Screening   Patient Details  Name: PRAYAG STUECK MRN: [id number] Date of Birth: November 29, 1985  Transition of Care [facility]) CM/SW Contact:    Bethann Berkshire, [place] Phone Number: 09/22/2022, 9:05 AM   CAGE-AID Screening: Substance Abuse Screening unable to be completed due to: : Patient unable to participate
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
## 2022-09-22 NOTE — ED Notes (Signed)
Report given to [facility] on 6N
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
## 2022-09-22 NOTE — Progress Notes (Signed)
Orthopedic Tech Progress Note Patient Details:  Richard Bailey 10-21-1985 [id number]  Patient ID: Richard Bailey, male   DOB: [birth year], 37 y.o.   MRN: [id number] I attended trauma page. Karolee Stamps 09/22/2022, 12:42 AM

## 2022-09-22 NOTE — Progress Notes (Signed)
Transition of Care [facility]) - CAGE-AID Screening   Patient Details  Name: Richard Bailey MRN: [id number] Date of Birth: 06-02-1986   Elvina Sidle, RN Trauma Response Nurse Phone Number: [phone] 09/22/2022, 12:38 PM        CAGE-AID Screening: Substance Abuse Screening unable to be completed due to: : Patient unable to participate  Have You Ever Felt You Ought to Cut Down on Your Drinking or Drug Use?: No Have People Annoyed You By Critizing Your Drinking Or Drug Use?: Yes Have You Felt Bad Or Guilty About Your Drinking Or Drug Use?: No Have You Ever Had a Drink or Used Drugs First Thing In The Morning to Steady Your Nerves or to Get Rid of a Hangover?: Yes CAGE-AID Score: 2  Substance Abuse Education Offered: No (pt refused any resources-- was sleeping, had to be awakened by lights on and speaking loudly to get pt to respond)
